# Patient Record
Sex: Female | Born: 1960 | Race: Black or African American | Hispanic: No | Marital: Single | Smoking: Never smoker
Health system: Southern US, Community
[De-identification: ages and names within clinical notes are randomized; demographics above are authoritative.]

## PROBLEM LIST (undated history)

## (undated) DIAGNOSIS — F329 Major depressive disorder, single episode, unspecified: Secondary | ICD-10-CM

## (undated) DIAGNOSIS — M792 Neuralgia and neuritis, unspecified: Secondary | ICD-10-CM

## (undated) DIAGNOSIS — R413 Other amnesia: Secondary | ICD-10-CM

## (undated) DIAGNOSIS — A048 Other specified bacterial intestinal infections: Secondary | ICD-10-CM

## (undated) DIAGNOSIS — R51 Headache: Principal | ICD-10-CM

## (undated) DIAGNOSIS — I7300 Raynaud's syndrome without gangrene: Secondary | ICD-10-CM

## (undated) DIAGNOSIS — M1990 Unspecified osteoarthritis, unspecified site: Secondary | ICD-10-CM

## (undated) DIAGNOSIS — M4690 Unspecified inflammatory spondylopathy, site unspecified: Secondary | ICD-10-CM

## (undated) DIAGNOSIS — G500 Trigeminal neuralgia: Secondary | ICD-10-CM

## (undated) DIAGNOSIS — F32A Depression, unspecified: Secondary | ICD-10-CM

## (undated) DIAGNOSIS — M47812 Spondylosis without myelopathy or radiculopathy, cervical region: Secondary | ICD-10-CM

## (undated) HISTORY — DX: Depression, unspecified: F32.A

## (undated) HISTORY — DX: Spondylosis without myelopathy or radiculopathy, cervical region: M47.812

## (undated) HISTORY — DX: Other specified bacterial intestinal infections: A04.8

## (undated) HISTORY — DX: Trigeminal neuralgia: G50.0

## (undated) HISTORY — DX: Headache: R51

## (undated) HISTORY — DX: Other amnesia: R41.3

## (undated) HISTORY — DX: Unspecified inflammatory spondylopathy, site unspecified: M46.90

## (undated) HISTORY — DX: Neuralgia and neuritis, unspecified: M79.2

## (undated) HISTORY — DX: Major depressive disorder, single episode, unspecified: F32.9

## (undated) HISTORY — PX: OTHER SURGICAL HISTORY: SHX169

## (undated) HISTORY — DX: Raynaud's syndrome without gangrene: I73.00

## (undated) HISTORY — DX: Unspecified osteoarthritis, unspecified site: M19.90

---
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
## 1983-01-11 HISTORY — PX: ABDOMINAL HYSTERECTOMY: SHX81

## 1983-01-11 HISTORY — PX: BREAST BIOPSY: SHX20

## 2012-07-29 ENCOUNTER — Emergency Department (HOSPITAL_COMMUNITY)
Admission: EM | Admit: 2012-07-29 | Discharge: 2012-07-29 | Disposition: A | Discharge status: Home / Self Care | Payer: Non-veteran care | Attending: Emergency Medicine | Admitting: Emergency Medicine

## 2012-07-29 ENCOUNTER — Emergency Department (HOSPITAL_COMMUNITY): Payer: Non-veteran care

## 2012-07-29 DIAGNOSIS — M25579 Pain in unspecified ankle and joints of unspecified foot: Secondary | ICD-10-CM | POA: Insufficient documentation

## 2012-07-29 DIAGNOSIS — L989 Disorder of the skin and subcutaneous tissue, unspecified: Secondary | ICD-10-CM | POA: Insufficient documentation

## 2012-07-29 DIAGNOSIS — M79671 Pain in right foot: Secondary | ICD-10-CM

## 2012-07-29 MED ORDER — HYDROCODONE-ACETAMINOPHEN 5-325 MG PO TABS
1.0000 | ORAL_TABLET | Freq: Once | ORAL | Status: AC
  Administered 2012-07-29: 1 via ORAL
  Filled 2012-07-29: qty 1

## 2012-07-29 MED ORDER — HYDROCODONE-ACETAMINOPHEN 5-325 MG PO TABS: 1.0000 | ORAL_TABLET | ORAL | Status: DC | PRN

## 2012-07-29 NOTE — ED Provider Notes (Signed)
History  This chart was scribed for Sharilyn Sites, PA-C working with Candyce Churn, MD by Greggory Stallion, ED scribe. This patient was seen in room TR09C/TR09C and the patient's care was started at 9:40 PM.  CSN: [id number] Arrival date & time 07/29/12  2127   Chief Complaint  Patient presents with  . Foot Pain   The history is provided by the patient. No language interpreter was used.    HPI Comments: Savannah Adkins is a 52 y.o. female who presents to the Emergency Department complaining of  sharp right foot pain that started earlier tonight. Pt denies trauma or injury to her right foot. Pt states she was eating dinner with her family and when she went to stand, there was sudden onset of sharp pain along the arch of her right foot.  No numbness or paresthesias of foot.  Patient has a large white lesion along the arch of her right foot which she states has been previously evaluated by podiatry without diagnosis.  Area has been increasing in size and causing pain when wearing shoes due to pressure.  No meds taken PTA.    No past medical history on file. No past surgical history on file. No family history on file. History  Substance Use Topics  . Smoking status: Not on file  . Smokeless tobacco: Not on file  . Alcohol Use: Not on file   OB History   No data available     Review of Systems  Musculoskeletal: Positive for arthralgias.  All other systems reviewed and are negative.    Allergies  Review of patient's allergies indicates not on file.  Home Medications  No current outpatient prescriptions on file.  BP 159/91  Pulse 98  Temp(Src) 98.3 F (36.8 C) (Oral)  Resp 18  SpO2 95%  Physical Exam  Nursing note and vitals reviewed. Constitutional: She is oriented to person, place, and time. She appears well-developed and well-nourished.  HENT:  Head: Normocephalic and atraumatic.  Mouth/Throat: Oropharynx is clear and moist.  Eyes: Conjunctivae and EOM are normal.  Pupils are equal, round, and reactive to light.  Neck: Normal range of motion.  Cardiovascular: Normal rate, regular rhythm and normal heart sounds.   Pulmonary/Chest: Effort normal and breath sounds normal.  Abdominal: Soft. Bowel sounds are normal.  Musculoskeletal:       Left ankle: She exhibits normal range of motion, no swelling, no ecchymosis, no deformity, no laceration and normal pulse. Tenderness. Achilles tendon normal.       Feet:  Pain directly adjacent to 2cm raised lesion at arch of right foot similar in appearance to a lipoma, no surrounding erythema, induration, or signs of cellulitis, normal ROM of foot, strong distal pulse, sensation intact  Neurological: She is alert and oriented to person, place, and time.  Skin: Skin is warm and dry.  Psychiatric: She has a normal mood and affect.    ED Course  Procedures (including critical care time)  DIAGNOSTIC STUDIES: Oxygen Saturation is 95% on RA, adequate by my interpretation.    COORDINATION OF CARE: 9:53 PM-Discussed treatment plan which includes xray with pt at bedside and pt agreed to plan.   Labs Reviewed - No data to display Dg Foot Complete Right  07/29/2012   *RADIOLOGY REPORT*  Clinical Data: Medial foot pain.  No known injury.  RIGHT FOOT COMPLETE - 3+ VIEW  Comparison: None.  Findings: No fracture or bone lesion.  The joints normally spaced and aligned.  The soft tissues  are unremarkable.  IMPRESSION: Normal right foot radiographs.   Original Report Authenticated By: Amie Portland, M.D.   1. Foot pain, right     MDM   X-ray negative for acute fracture dislocation. Lesion on foot appears to be lipoma with callus formation over top-- unsure if this is causing the pain but pt should have this evaluated further.  Rx vicodin.  [facility].  Discussed plan with pt, she agreed.  Return precautions advised.  I personally performed the services described in this documentation,  which was scribed in my presence. The recorded information has been reviewed and is accurate.   Garlon Hatchet, PA-C 07/29/12 2319  Garlon Hatchet, PA-C 07/29/12 [phone]

## 2012-07-29 NOTE — ED Notes (Signed)
Pt states anterior right foot started hurting right after dinner. Pain described as sharp. Denies trauma or injury.

## 2012-08-01 NOTE — ED Provider Notes (Signed)
Medical screening examination/treatment/procedure(s) were performed by non-physician practitioner and as supervising physician I was immediately available for consultation/collaboration.  Gokul Waybright David Evaan Tidwell, MD 08/01/12 1116 

## 2012-08-24 ENCOUNTER — Encounter: Payer: Self-pay | Admitting: Neurology

## 2012-08-24 ENCOUNTER — Ambulatory Visit (INDEPENDENT_AMBULATORY_CARE_PROVIDER_SITE_OTHER): Payer: Non-veteran care | Admitting: Neurology

## 2012-08-24 VITALS — BP 144/96 | HR 89 | Ht 66.0 in | Wt 198.0 lb

## 2012-08-24 DIAGNOSIS — R413 Other amnesia: Secondary | ICD-10-CM

## 2012-08-24 DIAGNOSIS — M47812 Spondylosis without myelopathy or radiculopathy, cervical region: Secondary | ICD-10-CM | POA: Insufficient documentation

## 2012-08-24 DIAGNOSIS — G500 Trigeminal neuralgia: Secondary | ICD-10-CM | POA: Insufficient documentation

## 2012-08-24 DIAGNOSIS — R209 Unspecified disturbances of skin sensation: Secondary | ICD-10-CM | POA: Insufficient documentation

## 2012-08-24 HISTORY — DX: Spondylosis without myelopathy or radiculopathy, cervical region: M47.812

## 2012-08-24 HISTORY — DX: Trigeminal neuralgia: G50.0

## 2012-08-24 HISTORY — DX: Other amnesia: R41.3

## 2012-08-24 MED ORDER — DULOXETINE HCL 30 MG PO CPEP: ORAL_CAPSULE | ORAL | Status: DC

## 2012-08-24 NOTE — Progress Notes (Signed)
Reason for visit: Numbness  Nayara Taplin is a 52 y.o. female  History of present illness:  Ms. Kendle is a 52 year old right-handed [place] female with a history of degenerative arthritis. The patient indicates that she has Raynaud's phenomenon, and at one point, she was felt to have lupus. The patient is on therapy for this, but recently, a rheumatologist indicated that she had osteoarthritis, not lupus. The patient reports that she has had some issues with intermittent numbness of the extremities, with numbness that comes and goes involving the hands up to the elbows, and the feet up to the hip level since 2009. The patient indicates that the numbness is present only at nighttime, and disappears during the day. This episode occurs every night, and it makes it difficult for her to sleep. The patient developed trigeminal neuralgia on the right V3 distribution in 2010, and she indicates that she underwent a brain scan through the [facility] The report of this is not available to me. The patient was tried on multiple medications for the trigeminal neuralgia, but she could not tolerate carbamazepine, or Dilantin. The patient was placed on gabapentin, and she could not tolerate this either secondary to burning in the feet and swelling in the ankles. The patient was told that she needed a gamma knife procedure, but she did not wish to do this. The patient reports ongoing problems with neck discomfort, and crepitus when turning the neck, particularly to the right side. The patient denies any weakness of the extremities, or difficulty controlling the bowels or the bladder. The patient denies any balance issues. The patient takes Flexeril, which does help some of her symptoms. The patient reports that she is having issues with her hair falling out. The patient does have some posterior headaches associated with the neck pain. The patient comes to this office for further evaluation.  Past Medical History  Diagnosis  Date  . Spondylitis   . Neuralgia     trigeminal  . Trigeminal neuralgia 08/24/2012    Right V2  . Cervical spondylosis without myelopathy 08/24/2012  . Memory disturbance 08/24/2012  . Depression   . Degenerative arthritis   . Raynaud disease   . H. pylori infection     Past Surgical History  Procedure Laterality Date  . Abdominal hysterectomy  1985  . Appendectomy  1972  . Breast biopsy  1985  . Tonsillectomy  1972  . Lumbosacral laminectomy      Family History  Problem Relation Age of Onset  . Cancer - Lung Brother   . Cancer Brother     stomach  . Cancer Maternal Aunt     breast, colon  . Diabetes Mother   . Heart attack Father     Social history:  reports that she has never smoked. She has never used smokeless tobacco. She reports that she does not drink alcohol or use illicit drugs.  Medications:  Current Outpatient Prescriptions on File Prior to Visit  Medication Sig Dispense Refill  . acetaminophen (TYLENOL) 325 MG tablet Take 650 mg by mouth every 6 (six) hours as needed for pain.      . cyclobenzaprine (FLEXERIL) 10 MG tablet Take 10 mg by mouth 3 (three) times daily as needed for muscle spasms.      Marland Kitchen dexlansoprazole (DEXILANT) 60 MG capsule Take 60 mg by mouth daily.      . hydroxychloroquine (PLAQUENIL) 200 MG tablet Take 400 mg by mouth daily.       No  current facility-administered medications on file prior to visit.    Allergies:  Allergies  Allergen Reactions  . Dilantin [Phenytoin Sodium Extended] Swelling  . Lortab [Hydrocodone-Acetaminophen] Itching  . Tramadol Itching  . Codeine Nausea And Vomiting  . Dilantin [Phenytoin]   . Erythromycin   . Gabapentin Other (See Comments)    Feet, head hurt and burning  . Lamisil [Terbinafine Hcl] Other (See Comments)    Elevated liver enzymes  . Penicillins Swelling  . Percocet [Oxycodone-Acetaminophen] Swelling  . Tetracyclines & Related     ROS:  Out of a complete 14 system review of symptoms,  the patient complains only of the following symptoms, and all other reviewed systems are negative.  Weight gain, fatigue Joint pain, joint swelling, achy muscles Memory loss, headache, numbness Anxiety, difficulty with sleep, change in appetite Restless legs  Blood pressure 144/96, pulse 89, height 5\' 6"  (1.676 m), weight 198 lb (89.812 kg).  Physical Exam  General: The patient is alert and cooperative at the time of the examination. The patient is minimally obese, affect is flat.  Head: Pupils are equal, round, and reactive to light. Discs are flat bilaterally.  Neck: The neck is supple, no carotid bruits are noted.  Respiratory: The respiratory examination is clear.  Cardiovascular: The cardiovascular examination reveals a regular rate and rhythm, no obvious murmurs or rubs are noted.  Neuromuscular: The patient lacks about 20 of full lateral rotation of the cervical spine bilaterally.  Skin: Extremities are without significant edema.  Neurologic Exam  Mental status:  Cranial nerves: Facial symmetry is present. There is good sensation of the face to pinprick and soft touch bilaterally. The strength of the facial muscles and the muscles to head turning and shoulder shrug are normal bilaterally. Speech is well enunciated, no aphasia or dysarthria is noted. Extraocular movements are full. Visual fields are full.  Motor: The motor testing reveals 5 over 5 strength of all 4 extremities. Good symmetric motor tone is noted throughout.  Sensory: Sensory testing is intact to pinprick, soft touch, vibration sensation, and position sense on all 4 extremities, with the exception that there is a stocking pattern pinprick sensory deficit up to the knees bilaterally. No evidence of extinction is noted.  Coordination: Cerebellar testing reveals good finger-nose-finger and heel-to-shin bilaterally.  Gait and station: Gait is normal. Tandem gait is normal. Romberg is negative. No drift is  seen.  Reflexes: Deep tendon reflexes are symmetric and normal bilaterally, with the exception that the left ankle jerk reflexes absent. Toes are downgoing bilaterally.   Assessment/Plan:  1. Episodic sensory changes, all 4 extremities  2. Osteoarthritis, cervical spondylosis  3. Right trigeminal neuralgia, V2 distribution  The patient will be set up for MRI evaluation of the brain, and of the cervical spine. Given the sensory alterations on all 4 extremities, and a history of trigeminal neuralgia, demyelinating disease needs to be ruled out. The patient will have some blood work done today. The patient will be placed on Cymbalta. The patient followup in 3-4 months.  Marlan Palau MD 08/26/2012 2:30 PM  [facility] [address] [place], [place] [address]  Phone [phone] Fax [phone]

## 2012-09-04 ENCOUNTER — Other Ambulatory Visit: Payer: Self-pay | Admitting: Neurology

## 2012-09-04 DIAGNOSIS — M47812 Spondylosis without myelopathy or radiculopathy, cervical region: Secondary | ICD-10-CM

## 2012-09-04 DIAGNOSIS — G500 Trigeminal neuralgia: Secondary | ICD-10-CM

## 2012-09-06 ENCOUNTER — Telehealth: Payer: Self-pay | Admitting: *Deleted

## 2012-09-06 LAB — RPR: RPR: NONREACTIVE

## 2012-09-06 LAB — TSH: TSH: 1.35 u[IU]/mL (ref 0.450–4.500)

## 2012-09-06 LAB — VITAMIN B12: Vitamin B-12: 988 pg/mL — ABNORMAL HIGH (ref 211–946)

## 2012-09-06 NOTE — Progress Notes (Signed)
Quick Note:  I spoke to patient and relayed normal labs, per Dr. Anne Hahn. ______

## 2012-09-06 NOTE — Progress Notes (Signed)
Add on with a creatinine level is noted.

## 2012-09-06 NOTE — Telephone Encounter (Signed)
Called with creatinine results 0.96.  Will fax to [facility] for MRI to be done tomorrow. Faxed to [phone].

## 2012-09-06 NOTE — Telephone Encounter (Signed)
Chip Boer called and is needing creatinine level for pt that is scheduled for Friday at 1730 tomorrow for contrast study.  Can be added on to last labs done, just need for [place] to call.   I called and spoke to [place] [phone] and asked her to add on to last labs drawn 09-04-12 as a stat.   She did this.  Will call with results.

## 2012-09-07 ENCOUNTER — Ambulatory Visit
Admission: RE | Admit: 2012-09-07 | Discharge: 2012-09-07 | Disposition: A | Discharge status: Home / Self Care | Payer: Non-veteran care | Source: Ambulatory Visit | Attending: Neurology | Admitting: Neurology

## 2012-09-07 DIAGNOSIS — G500 Trigeminal neuralgia: Secondary | ICD-10-CM

## 2012-09-07 DIAGNOSIS — M47812 Spondylosis without myelopathy or radiculopathy, cervical region: Secondary | ICD-10-CM

## 2012-09-07 DIAGNOSIS — R209 Unspecified disturbances of skin sensation: Secondary | ICD-10-CM

## 2012-09-07 MED ORDER — GADOBENATE DIMEGLUMINE 529 MG/ML IV SOLN
18.0000 mL | Freq: Once | INTRAVENOUS | Status: AC | PRN
  Administered 2012-09-07: 18 mL via INTRAVENOUS

## 2012-09-11 ENCOUNTER — Telehealth: Payer: Self-pay | Admitting: Neurology

## 2012-09-11 NOTE — Telephone Encounter (Signed)
I called patient. The MRI study of the brain and cervical spine do not show evidence of demyelinating disease. I discussed this with the patient.

## 2012-09-12 LAB — SPECIMEN STATUS REPORT

## 2012-10-09 ENCOUNTER — Encounter: Payer: Self-pay | Admitting: Neurology

## 2012-10-31 ENCOUNTER — Telehealth: Payer: Self-pay | Admitting: *Deleted

## 2012-11-01 NOTE — Telephone Encounter (Signed)
Pt came into the office. She explained that she received bill for her MRI's done back in 08/2012 ($4000.00).  She signed release for herself and Roosvelt Harps to fax records to them re: ordering of MRI's.  I gave her copies also (ofv note, MRI and labs).  [facility] Medicus [facility].

## 2012-11-01 NOTE — Telephone Encounter (Signed)
Patient came in and  picked up needed information

## 2013-03-14 ENCOUNTER — Encounter: Payer: Self-pay | Admitting: Neurology

## 2013-03-14 ENCOUNTER — Ambulatory Visit (INDEPENDENT_AMBULATORY_CARE_PROVIDER_SITE_OTHER): Payer: Non-veteran care | Admitting: Neurology

## 2013-03-14 ENCOUNTER — Encounter (INDEPENDENT_AMBULATORY_CARE_PROVIDER_SITE_OTHER): Payer: Self-pay

## 2013-03-14 VITALS — BP 137/94 | HR 94 | Wt 194.0 lb

## 2013-03-14 DIAGNOSIS — G500 Trigeminal neuralgia: Secondary | ICD-10-CM

## 2013-03-14 DIAGNOSIS — G4486 Cervicogenic headache: Secondary | ICD-10-CM

## 2013-03-14 DIAGNOSIS — S139XXA Sprain of joints and ligaments of unspecified parts of neck, initial encounter: Secondary | ICD-10-CM

## 2013-03-14 DIAGNOSIS — M47812 Spondylosis without myelopathy or radiculopathy, cervical region: Secondary | ICD-10-CM

## 2013-03-14 DIAGNOSIS — R51 Headache: Secondary | ICD-10-CM

## 2013-03-14 HISTORY — DX: Cervicogenic headache: G44.86

## 2013-03-14 MED ORDER — PREGABALIN 50 MG PO CAPS: 50.0000 mg | ORAL_CAPSULE | Freq: Two times a day (BID) | ORAL | Status: DC

## 2013-03-14 NOTE — Progress Notes (Signed)
Reason for visit: Trigeminal neuralgia, cervicogenic headache  Savannah Adkins is an 53 y.o. female  History of present illness:  Savannah Adkins is a 53 year old right-handed Savannah Adkins female with a history of cervical spondylosis, and cervical spasm. The patient has cervicogenic headache associated with this. The patient also reports some right-sided facial pain thought secondary to trigeminal neuralgia. In the past, the patient has not been intolerant to multiple medications that have included Dilantin, and carbamazepine and gabapentin. The patient was given a prescription for Cymbalta, but the [facility] would not cover this medication. The patient has been on Flexeril taking 10 mg 3 times daily, and she was placed on Zoloft taking 50 mg daily. The patient had one session of physical therapy through the [facility], and the patient was given cervical traction which resulted in a significant exacerbation of her neck pain and spasm. The cervical spine mobility has markedly been reduced. The patient has had increased pain. The patient denies pain down the arms, but she does have pain in the neck and into the shoulders. The patient has dizziness associated with the pain, and headaches coming up from the back of the head. The patient returns to this office for an evaluation. MRI evaluation of the brain shows minimal white matter changes, and MRI of the cervical spine shows some disc bulges and spondylosis. The patient overall is doing less well than when she was seen here last. The patient is unable to tolerate nonsteroidal anti-inflammatory medications.  Past Medical History  Diagnosis Date  . Spondylitis   . Neuralgia     trigeminal  . Trigeminal neuralgia 08/24/2012    Right V2  . Cervical spondylosis without myelopathy 08/24/2012  . Memory disturbance 08/24/2012  . Depression   . Degenerative arthritis   . Raynaud disease   . H. pylori infection   . Cervicogenic headache 03/14/2013    Past Surgical  History  Procedure Laterality Date  . Abdominal hysterectomy  1985  . Appendectomy  1972  . Breast biopsy  1985  . Tonsillectomy  1972  . Lumbosacral laminectomy      Family History  Problem Relation Age of Onset  . Cancer - Lung Brother   . Cancer Brother     stomach  . Cancer Maternal Aunt     breast, colon  . Diabetes Mother   . Heart attack Father   . Cancer Brother     Social history:  reports that she has never smoked. She has never used smokeless tobacco. She reports that she does not drink alcohol or use illicit drugs.    Allergies  Allergen Reactions  . Dilantin [Phenytoin Sodium Extended] Swelling  . Lortab [Hydrocodone-Acetaminophen] Itching  . Tramadol Itching  . Codeine Nausea And Vomiting  . Dilantin [Phenytoin]   . Erythromycin   . Gabapentin Other (See Comments)    Feet, head hurt and burning  . Lamisil [Terbinafine Hcl] Other (See Comments)    Elevated liver enzymes  . Penicillins Swelling  . Percocet [Oxycodone-Acetaminophen] Swelling  . Tetracyclines & Related     Medications:  Current Outpatient Prescriptions on File Prior to Visit  Medication Sig Dispense Refill  . acetaminophen (TYLENOL) 325 MG tablet Take 650 mg by mouth every 6 (six) hours as needed for pain.      . Cetirizine HCl 10 MG CAPS Take by mouth daily.      . cyclobenzaprine (FLEXERIL) 10 MG tablet Take 10 mg by mouth 3 (three) times daily  as needed for muscle spasms.      Marland Kitchen. dexlansoprazole (DEXILANT) 60 MG capsule Take 60 mg by mouth daily.      . hydroxychloroquine (PLAQUENIL) 200 MG tablet Take 400 mg by mouth daily.       No current facility-administered medications on file prior to visit.    ROS:  Out of a complete 14 system review of symptoms, the patient complains only of the following symptoms, and all other reviewed systems are negative.  Neck pain, neck stiffness Abdominal pain, nausea Back pain Memory loss, dizziness, headache, tremors  Blood pressure 137/94,  pulse 94, weight 194 lb (87.998 kg).  Physical Exam  General: The patient is alert and cooperative at the time of the examination.  Neuromuscular: The patient has minimal rotational movement of the cervical spine, and flexion and extension of the neck is severely limited.  Skin: No significant peripheral edema is noted.   Neurologic Exam  Mental status: The Mini-Mental status examination done today shows a total score 27/30.  Cranial nerves: Facial symmetry is present. Speech is normal, no aphasia or dysarthria is noted. Extraocular movements are full. Visual fields are full.  Motor: The patient has good strength in all 4 extremities.  Sensory examination: Soft touch sensation on the face and hands is symmetric.  Coordination: The patient has good finger-nose-finger and heel-to-shin bilaterally.  Gait and station: The patient has a normal gait. Tandem gait is normal. Romberg is negative. No drift is seen.  Reflexes: Deep tendon reflexes are symmetric.    MRI brain 09/11/2012:  IMPRESSION:  Equivocal MRI brain (with and without) demonstrating:  1. Minimal juxtacortical foci of non-specific gliosis. No abnormal lesions are seen on post contrast views.  2. No acute findings.   MRI cervical spine 09/11/2012:  IMPRESSION:  Abnormal MRI cervical spine (without) demonstrating:  1. At C3-4: disc bulging with moderate right and mild left foraminal stenosis  2. Disc bulging from C3-4 to C6-7 and at T2-3.    Assessment/Plan:  1. Cervical spondylosis  2. Cervical strain syndrome  3. Cervicogenic headache  4. Right sided trigeminal neuralgia  The patient will remain on the Flexeril taking 10 mg 3 times daily. The patient will be set up for neuromuscular therapy of the neck and shoulders. The patient will also be set up for an epidural steroid injection of the cervical spine. The patient will followup through this office in about 4 months.  Marlan Palau. Keith Janavia Rottman MD 03/14/2013 1:57  PM  [facility] [address] [place], [place] [id number]  Phone [phone] Fax [phone]

## 2013-03-14 NOTE — Patient Instructions (Signed)
Take multivitamins with Zinc and Selenium.   Cervical Sprain A cervical sprain is when the tissues (ligaments) that hold the neck bones in place stretch or tear. HOME CARE   Put ice on the injured area.  Put ice in a plastic bag.  Place a towel between your skin and the bag.  Leave the ice on for 15 20 minutes, 3 4 times a day.  You may have been given a collar to wear. This collar keeps your neck from moving while you heal.  Do not take the collar off unless told by your doctor.  If you have long hair, keep it outside of the collar.  Ask your doctor before changing the position of your collar. You may need to change its position over time to make it more comfortable.  If you are allowed to take off the collar for cleaning or bathing, follow your doctor's instructions on how to do it safely.  Keep your collar clean by wiping it with mild soap and water. Dry it completely. If the collar has removable pads, remove them every 1 2 days to hand wash them with soap and water. Allow them to air dry. They should be dry before you wear them in the collar.  Do not drive while wearing the collar.  Only take medicine as told by your doctor.  Keep all doctor visits as told.  Keep all physical therapy visits as told.  Adjust your work station so that you have good posture while you work.  Avoid positions and activities that make your problems worse.  Warm up and stretch before being active. GET HELP IF:  Your pain is not controlled with medicine.  You cannot take less pain medicine over time as planned.  Your activity level does not improve as expected. GET HELP RIGHT AWAY IF:   You are bleeding.  Your stomach is upset.  You have an allergic reaction to your medicine.  You develop new problems that you cannot explain.  You lose feeling (become numb) or you cannot move any part of your body (paralysis).  You have tingling or weakness in any part of your body.  Your  symptoms get worse. Symptoms include:  Pain, soreness, stiffness, puffiness (swelling), or a burning feeling in your neck.  Pain when your neck is touched.  Shoulder or upper back pain.  Limited ability to move your neck.  Headache.  Dizziness.  Your hands or arms feel week, lose feeling, or tingle.  Muscle spasms.  Difficulty swallowing or chewing. MAKE SURE YOU:   Understand these instructions.  Will watch your condition.  Will get help right away if you are not doing well or get worse. Document Released: 06/15/2007 Document Revised: 08/29/2012 Document Reviewed: 07/04/2012 [facility] Patient Information 2014 [place], [place].

## 2013-03-28 ENCOUNTER — Ambulatory Visit: Payer: Non-veteran care | Admitting: Physical Therapy

## 2013-04-01 ENCOUNTER — Ambulatory Visit: Payer: Non-veteran care | Admitting: Physical Therapy

## 2013-04-09 ENCOUNTER — Ambulatory Visit: Payer: Non-veteran care | Attending: Internal Medicine

## 2013-04-09 DIAGNOSIS — IMO0001 Reserved for inherently not codable concepts without codable children: Secondary | ICD-10-CM | POA: Insufficient documentation

## 2013-04-09 DIAGNOSIS — M542 Cervicalgia: Secondary | ICD-10-CM | POA: Insufficient documentation

## 2013-04-09 DIAGNOSIS — R293 Abnormal posture: Secondary | ICD-10-CM | POA: Insufficient documentation

## 2013-04-15 ENCOUNTER — Ambulatory Visit: Payer: Non-veteran care | Attending: Internal Medicine | Admitting: Rehabilitation

## 2013-04-15 DIAGNOSIS — IMO0001 Reserved for inherently not codable concepts without codable children: Secondary | ICD-10-CM | POA: Diagnosis not present

## 2013-04-15 DIAGNOSIS — R293 Abnormal posture: Secondary | ICD-10-CM | POA: Diagnosis not present

## 2013-04-15 DIAGNOSIS — M542 Cervicalgia: Secondary | ICD-10-CM | POA: Insufficient documentation

## 2013-04-17 ENCOUNTER — Ambulatory Visit: Payer: Non-veteran care | Admitting: Rehabilitation

## 2013-04-17 DIAGNOSIS — IMO0001 Reserved for inherently not codable concepts without codable children: Secondary | ICD-10-CM | POA: Diagnosis not present

## 2013-04-22 ENCOUNTER — Ambulatory Visit: Payer: Non-veteran care | Admitting: Physical Therapy

## 2013-04-22 DIAGNOSIS — IMO0001 Reserved for inherently not codable concepts without codable children: Secondary | ICD-10-CM | POA: Diagnosis not present

## 2013-04-24 ENCOUNTER — Ambulatory Visit: Payer: Non-veteran care | Admitting: Rehabilitation

## 2013-04-24 DIAGNOSIS — IMO0001 Reserved for inherently not codable concepts without codable children: Secondary | ICD-10-CM | POA: Diagnosis not present

## 2013-04-30 ENCOUNTER — Ambulatory Visit: Payer: Non-veteran care | Admitting: Physical Therapy

## 2013-04-30 DIAGNOSIS — IMO0001 Reserved for inherently not codable concepts without codable children: Secondary | ICD-10-CM | POA: Diagnosis not present

## 2013-05-06 ENCOUNTER — Ambulatory Visit: Payer: Non-veteran care | Admitting: Rehabilitation

## 2013-05-06 DIAGNOSIS — IMO0001 Reserved for inherently not codable concepts without codable children: Secondary | ICD-10-CM | POA: Diagnosis not present

## 2013-05-08 ENCOUNTER — Ambulatory Visit: Payer: Non-veteran care | Admitting: Rehabilitation

## 2013-05-14 ENCOUNTER — Ambulatory Visit: Payer: Non-veteran care | Attending: Internal Medicine | Admitting: Physical Therapy

## 2013-05-14 DIAGNOSIS — M542 Cervicalgia: Secondary | ICD-10-CM | POA: Insufficient documentation

## 2013-05-14 DIAGNOSIS — R293 Abnormal posture: Secondary | ICD-10-CM | POA: Insufficient documentation

## 2013-05-14 DIAGNOSIS — IMO0001 Reserved for inherently not codable concepts without codable children: Secondary | ICD-10-CM | POA: Insufficient documentation

## 2013-07-25 ENCOUNTER — Ambulatory Visit (INDEPENDENT_AMBULATORY_CARE_PROVIDER_SITE_OTHER): Payer: Non-veteran care | Admitting: Neurology

## 2013-07-25 ENCOUNTER — Encounter: Payer: Self-pay | Admitting: Neurology

## 2013-07-25 VITALS — BP 127/83 | HR 91 | Ht 67.0 in | Wt 199.0 lb

## 2013-07-25 DIAGNOSIS — R51 Headache: Secondary | ICD-10-CM | POA: Diagnosis not present

## 2013-07-25 DIAGNOSIS — G4486 Cervicogenic headache: Secondary | ICD-10-CM

## 2013-07-25 DIAGNOSIS — G500 Trigeminal neuralgia: Secondary | ICD-10-CM | POA: Diagnosis not present

## 2013-07-25 DIAGNOSIS — M47812 Spondylosis without myelopathy or radiculopathy, cervical region: Secondary | ICD-10-CM | POA: Diagnosis not present

## 2013-07-25 MED ORDER — TIZANIDINE HCL 2 MG PO TABS: 2.0000 mg | ORAL_TABLET | Freq: Three times a day (TID) | ORAL | Status: AC

## 2013-07-25 NOTE — Patient Instructions (Signed)
Trigeminal Neuralgia  Trigeminal neuralgia is a nerve disorder that causes sudden attacks of severe facial pain. It is caused by damage to the trigeminal nerve, a major nerve in the face. It is more common in women and in the elderly, although it can also happen in younger patients. Attacks last from a few seconds to several minutes and can occur from a couple of times per year to several times per day. Trigeminal neuralgia can be a very distressing and disabling condition. Surgery may be needed in very severe cases if medical treatment does not give relief.  HOME CARE INSTRUCTIONS    If your caregiver prescribed medication to help prevent attacks, take as directed.   To help prevent attacks:   Chew on the unaffected side of the mouth.   Avoid touching your face.   Avoid blasts of hot or cold air.   Men may wish to grow a beard to avoid having to shave.  SEEK IMMEDIATE MEDICAL CARE IF:   Pain is unbearable and your medicine does not help.   You develop new, unexplained symptoms (problems).   You have problems that may be related to a medication you are taking.  Document Released: 12/25/1999 Document Revised: 03/21/2011 Document Reviewed: 10/24/2008  ExitCare Patient Information 2015 [employer]. This information is not intended to replace advice given to you by your health care provider. Make sure you discuss any questions you have with your health care provider.

## 2013-07-25 NOTE — Progress Notes (Signed)
Reason for visit: Cervicogenic headache  Charlestine Nightrish E Gomez is an 53 y.o. female  History of present illness:  Ms. Vedia CofferBlack is a 53 year old right-handed [place] female with a history of possible lupus, and degenerative arthritis. The patient has cervical spondylosis documented by cervical MRI. She also has some trigeminal neuralgia issues. The patient has been unable to tolerate several medications such as Lyrica and gabapentin, Dilantin, and carbamazepine. The patient has a lot of neck discomfort, and she will have intermittent tingling down arms associated with this. The patient has undergone physical therapy, and she seemed to be gaining benefit, but the number of visits was limited [facility]. The patient was placed on Flexeril, taking 10 mg 3 times daily. She indicates that this is well-tolerated, but she is having some problems with blurring of vision she stands up too long. She is trying to stretch out on a regular basis, but her mobility of the cervical spine has declined. She returns to this office for an evaluation. The patient reports that her headaches come up in the back of the head, up to the top of the head.   Past Medical History  Diagnosis Date  . Spondylitis   . Neuralgia     trigeminal  . Trigeminal neuralgia 08/24/2012    Right V2  . Cervical spondylosis without myelopathy 08/24/2012  . Memory disturbance 08/24/2012  . Depression   . Degenerative arthritis   . Raynaud disease   . H. pylori infection   . Cervicogenic headache 03/14/2013    Past Surgical History  Procedure Laterality Date  . Abdominal hysterectomy  1985  . Appendectomy  1972  . Breast biopsy  1985  . Tonsillectomy  1972  . Lumbosacral laminectomy      Family History  Problem Relation Age of Onset  . Cancer - Lung Brother   . Cancer Brother     stomach  . Cancer Maternal Aunt     breast, colon  . Diabetes Mother   . Heart attack Father   . Cancer Brother     Social history:  reports  that she has never smoked. She has never used smokeless tobacco. She reports that she does not drink alcohol or use illicit drugs.    Allergies  Allergen Reactions  . Dilantin [Phenytoin Sodium Extended] Swelling  . Lortab [Hydrocodone-Acetaminophen] Itching  . Tramadol Itching  . Codeine Nausea And Vomiting  . Dilantin [Phenytoin]   . Erythromycin   . Gabapentin Other (See Comments)    Feet, head hurt and burning  . Lamisil [Terbinafine Hcl] Other (See Comments)    Elevated liver enzymes  . Lyrica [Pregabalin] Swelling  . Penicillins Swelling  . Percocet [Oxycodone-Acetaminophen] Swelling  . Tetracyclines & Related     Medications:  Current Outpatient Prescriptions on File Prior to Visit  Medication Sig Dispense Refill  . acetaminophen (TYLENOL) 325 MG tablet Take 650 mg by mouth every 6 (six) hours as needed for pain.      . Cetirizine HCl 10 MG CAPS Take by mouth daily.      Marland Kitchen. dexlansoprazole (DEXILANT) 60 MG capsule Take 60 mg by mouth daily.      . hydroxychloroquine (PLAQUENIL) 200 MG tablet Take 400 mg by mouth daily.      . sertraline (ZOLOFT) 25 MG tablet Take 50 mg by mouth daily.       No current facility-administered medications on file prior to visit.    ROS:  Out of a complete  14 system review of symptoms, the patient complains only of the following symptoms, and all other reviewed systems are negative.  Blurred vision Excessive eating Joint pain, back pain, walking difficulties, neck pain Dizziness, headache, numbness Depression, anxiety  Blood pressure 127/83, pulse 91, height 5\' 7"  (1.702 m), weight 199 lb (90.266 kg).  Physical Exam  General: The patient is alert and cooperative at the time of the examination. The patient has a flat affect.  Neuromuscular: The patient has significant limitation of rotational movement cervical spine, able to turn the head only about 20 to the right and left.  Skin: No significant peripheral edema is  noted.   Neurologic Exam  Mental status: The patient is oriented x 3.  Cranial nerves: Facial symmetry is present. Speech is normal, no aphasia or dysarthria is noted. Extraocular movements are full. Visual fields are full.  Motor: The patient has good strength in all 4 extremities.  Sensory examination: Soft touch sensation on the face, arms, and legs is symmetric.  Coordination: The patient has good finger-nose-finger and heel-to-shin bilaterally.  Gait and station: The patient has a normal gait. Tandem gait is normal. Romberg is negative. No drift is seen.  Reflexes: Deep tendon reflexes are symmetric.   Cervical spine MRI 09/11/2012:  IMPRESSION:  Abnormal MRI cervical spine (without) demonstrating:  1. At C3-4: disc bulging with moderate right and mild left foraminal stenosis  2. Disc bulging from C3-4 to C6-7 and at T2-3.    Assessment/Plan:  1. Trigeminal neuralgia, right V2 distribution  2. Cervical spondylosis  3. Cervicogenic headache  The patient may be having some side effects on the Flexeril, she reports blurred vision with standing. The patient will be off of Flexeril, and switched to tizanidine. She will start at a 2 mg 3 times daily dose, but this dose can be increased depending on her tolerance and response to the medication. The patient will be set up once again for a cervical epidural steroid injection, this was never done previously. The patient will continue her neuromuscular therapy, stretching exercises. She will followup in 3-4 months.  Marlan Palau MD 07/25/2013 8:27 AM  [facility] [address] [place], [place] [address]  Phone [phone] Fax [phone]   Primary care physician for this patient to the [facility] is:  Dr. [facility] Phone number [phone], extension [phone] Fax number [phone].

## 2013-07-29 ENCOUNTER — Other Ambulatory Visit: Payer: Self-pay | Admitting: Neurology

## 2013-07-29 DIAGNOSIS — G4486 Cervicogenic headache: Secondary | ICD-10-CM

## 2013-07-29 DIAGNOSIS — R51 Headache: Secondary | ICD-10-CM

## 2013-07-29 DIAGNOSIS — M47812 Spondylosis without myelopathy or radiculopathy, cervical region: Secondary | ICD-10-CM

## 2013-07-29 DIAGNOSIS — G500 Trigeminal neuralgia: Secondary | ICD-10-CM

## 2013-07-31 ENCOUNTER — Telehealth: Payer: Self-pay | Admitting: Neurology

## 2013-07-31 NOTE — Telephone Encounter (Signed)
Spoke to [place] at [facility] and she received a referral for epidural injection for patient.  The [place] [facility] and they need to authorize before procedure can be done.  The doctors CMA is aware and will follow up.  If the [facility] approves to have [facility] we will notify them.

## 2013-11-21 ENCOUNTER — Encounter: Payer: Self-pay | Admitting: Adult Health

## 2013-11-21 ENCOUNTER — Ambulatory Visit (INDEPENDENT_AMBULATORY_CARE_PROVIDER_SITE_OTHER): Payer: Non-veteran care | Admitting: Adult Health

## 2013-11-21 VITALS — BP 130/82 | HR 85 | Temp 98.5°F | Ht 66.0 in | Wt 197.0 lb

## 2013-11-21 DIAGNOSIS — G500 Trigeminal neuralgia: Secondary | ICD-10-CM

## 2013-11-21 DIAGNOSIS — M47812 Spondylosis without myelopathy or radiculopathy, cervical region: Secondary | ICD-10-CM

## 2013-11-21 DIAGNOSIS — R51 Headache: Secondary | ICD-10-CM

## 2013-11-21 DIAGNOSIS — G4486 Cervicogenic headache: Secondary | ICD-10-CM

## 2013-11-21 MED ORDER — OXCARBAZEPINE 300 MG PO TABS: 300.0000 mg | ORAL_TABLET | Freq: Every day | ORAL | Status: AC

## 2013-11-21 NOTE — Patient Instructions (Signed)

## 2013-11-21 NOTE — Progress Notes (Signed)
I have read the note, and I agree with the clinical assessment and plan.  Savannah Adkins KEITH   

## 2013-11-21 NOTE — Progress Notes (Signed)
PATIENT: Savannah Adkins DOB: 1960/02/02  REASON FOR VISIT: follow up HISTORY FROM: patient  HISTORY OF PRESENT ILLNESS: Savannah Adkins is a 53 year old female with a history of cervical spondylosis, cervicogenic headache and trigeminal neuralgia. She was started on Tizanidine and reports that it was not  been beneficial at first. Her PCP double her dose to 4 mg TID for about 1 week ago and she has already noticed a benefit. She is getting ready to start therapy through the [place].  She was also sent for an epidural steroid injection but the [facility] has not yet approved this procedure so she wasn't able to have that done yet.  She states that she does have trigeminal nerve pain and was on trileptal but has not been on it for a while. She states recently she had 3-4 flare ups a week in the right side. She stopped taking this medication  when she moved. She would like to restart the trileptal.    HISTORY 07/25/13 (WILLIS):  53 year old right-handed Savannah Adkins female with a history of possible lupus, and degenerative arthritis. The patient has cervical spondylosis documented by cervical MRI. She also has some trigeminal neuralgia issues. The patient has been unable to tolerate several medications such as Lyrica and gabapentin, Dilantin, and carbamazepine. The patient has a lot of neck discomfort, and she will have intermittent tingling down arms associated with this. The patient has undergone physical therapy, and she seemed to be gaining benefit, but the number of visits was limited through the [facility]. The patient was placed on Flexeril, taking 10 mg 3 times daily. She indicates that this is well-tolerated, but she is having some problems with blurring of vision she stands up too long. She is trying to stretch out on a regular basis, but her mobility of the cervical spine has declined. She returns to this office for an evaluation. The patient reports that her headaches come up in the back of the head, up to the top of  the head.   REVIEW OF SYSTEMS: Out of a complete 14 system review of symptoms, the patient complains only of the following symptoms, and all other reviewed systems are negative.  Appetite change Excessive eating Abdominal pain, diarrhea Restless leg, insomnia Joint pain, back pain, aching muscles, muscle cramps, neck pain, neck stiffness Itching Memory loss, headache, numbness, tremors Depression    ALLERGIES: Allergies  Allergen Reactions  . Dilantin [Phenytoin Sodium Extended] Swelling  . Lortab [Hydrocodone-Acetaminophen] Itching  . Tramadol Itching  . Codeine Nausea And Vomiting  . Dilantin [Phenytoin]   . Erythromycin   . Gabapentin Other (See Comments)    Feet, head hurt and burning  . Lamisil [Terbinafine Hcl] Other (See Comments)    Elevated liver enzymes  . Lyrica [Pregabalin] Swelling  . Penicillins Swelling  . Percocet [Oxycodone-Acetaminophen] Swelling  . Tetracyclines & Related     HOME MEDICATIONS: Outpatient Prescriptions Prior to Visit  Medication Sig Dispense Refill  . acetaminophen (TYLENOL) 325 MG tablet Take 650 mg by mouth every 6 (six) hours as needed for pain.    . Cetirizine HCl 10 MG CAPS Take by mouth daily.    Marland Kitchen dexlansoprazole (DEXILANT) 60 MG capsule Take 60 mg by mouth daily.    . hydroxychloroquine (PLAQUENIL) 200 MG tablet Take 400 mg by mouth daily.    . sertraline (ZOLOFT) 25 MG tablet Take 50 mg by mouth daily.    Marland Kitchen tiZANidine (ZANAFLEX) 2 MG tablet Take 1 tablet (2 mg  total) by mouth 3 (three) times daily. 90 tablet 3   No facility-administered medications prior to visit.    PAST MEDICAL HISTORY: Past Medical History  Diagnosis Date  . Spondylitis   . Neuralgia     trigeminal  . Trigeminal neuralgia 08/24/2012    Right V2  . Cervical spondylosis without myelopathy 08/24/2012  . Memory disturbance 08/24/2012  . Depression   . Degenerative arthritis   . Raynaud disease   . H. pylori infection   . Cervicogenic headache  03/14/2013    PAST SURGICAL HISTORY: Past Surgical History  Procedure Laterality Date  . Abdominal hysterectomy  1985  . Appendectomy  1972  . Breast biopsy  1985  . Tonsillectomy  1972  . Lumbosacral laminectomy      FAMILY HISTORY: Family History  Problem Relation Age of Onset  . Cancer - Lung Brother   . Cancer Brother     stomach  . Cancer Maternal Aunt     breast, colon  . Diabetes Mother   . Heart attack Father   . Cancer Brother     SOCIAL HISTORY: History   Social History  . Marital Status: Single    Spouse Name: N/A    Number of Children: 2  . Years of Education: 12   Occupational History  . unemployed12    Social History Main Topics  . Smoking status: Never Smoker   . Smokeless tobacco: Never Used  . Alcohol Use: No  . Drug Use: No  . Sexual Activity: Not on file   Other Topics Concern  . Not on file   Social History Narrative      PHYSICAL EXAM  Filed Vitals:   11/21/13 1402  BP: 130/82  Pulse: 85  Temp: 98.5 F (36.9 C)  TempSrc: Oral  Height: 5\' 6"  (1.676 m)  Weight: 197 lb (89.359 kg)   Body mass index is 31.81 kg/(m^2).  Generalized: Well developed, in no acute distress   Neurological examination  Mentation: Alert oriented to time, place, history taking. Follows all commands speech and language fluent Cranial nerve II-XII: Pupils were equal round reactive to light. Extraocular movements were full, visual field were full on confrontational test. Facial sensation and strength were normal. Uvula tongue midline. Head turning and shoulder shrug  were normal and symmetric. Motor: The motor testing reveals 5 over 5 strength of all 4 extremities. Good symmetric motor tone is noted throughout.  Sensory: Sensory testing is intact to soft touch on all 4 extremities. No evidence of extinction is noted.  Coordination: Cerebellar testing reveals good finger-nose-finger and heel-to-shin bilaterally.  Gait and station: Gait is normal. Tandem  gait is normal. Romberg is negative. No drift is seen.  Reflexes: Deep tendon reflexes are symmetric and normal bilaterally.    DIAGNOSTIC DATA (LABS, IMAGING, TESTING) - I reviewed patient records, labs, notes, testing and imaging myself where available.  No results found for: WBC, HGB, HCT, MCV, PLT    Component Value Date/Time   CREATININE 0.96 09/04/2012 1005   GFRNONAA 67 09/04/2012 1005   GFRAA 78 09/04/2012 1005   Lab Results  Component Value Date   VITAMINB12 988* 09/04/2012   Lab Results  Component Value Date   TSH 1.350 09/04/2012      ASSESSMENT AND PLAN 53 y.o. year old female  has a past medical history of Spondylitis; Neuralgia; Trigeminal neuralgia (08/24/2012); Cervical spondylosis without myelopathy (08/24/2012); Memory disturbance (08/24/2012); Depression; Degenerative arthritis; Raynaud disease; H. pylori infection; and Cervicogenic headache (  03/14/2013). here with:  1. Cervical spondylosis 2. Cervicogenic headache 3. Trigeminal Neuralgia   Tizanidine was increased by her PCP. She states that her neck discomfort has improved in the last week. She will also be [facility] this [facility]. She was on Trileptal for trigeminal Neuralgia but had stopped taking it when she moved. She has been having 3-4 flare ups each week on the right side. She would like a prescription for this. I will give her Trileptal 300 mg daily. We may have to increase the dose in the future. If the patients symptoms worsen or she develops new symptoms she should let us know. She will follow-up in 6 months or sooner if needed.   Butch PennyMegan Ridhima Golberg, MSN, NP-C 11/21/2013, 2:24 PM [facility] [address], [address] [place], [place] [address] [phone]  Note: This document was prepared with digital dictation and possible smart phrase technology. Any transcriptional errors that result from this process are unintentional.

## 2013-11-25 ENCOUNTER — Ambulatory Visit: Payer: Non-veteran care | Admitting: Adult Health

## 2014-09-16 IMAGING — CR DG FOOT COMPLETE 3+V*R*
3 series · 3 of 3 positions shown · non-contrast
Comparison: None.

CLINICAL DATA: Medial foot pain.  No known injury.

RIGHT FOOT COMPLETE - 3+ VIEW

[x foot lat right]
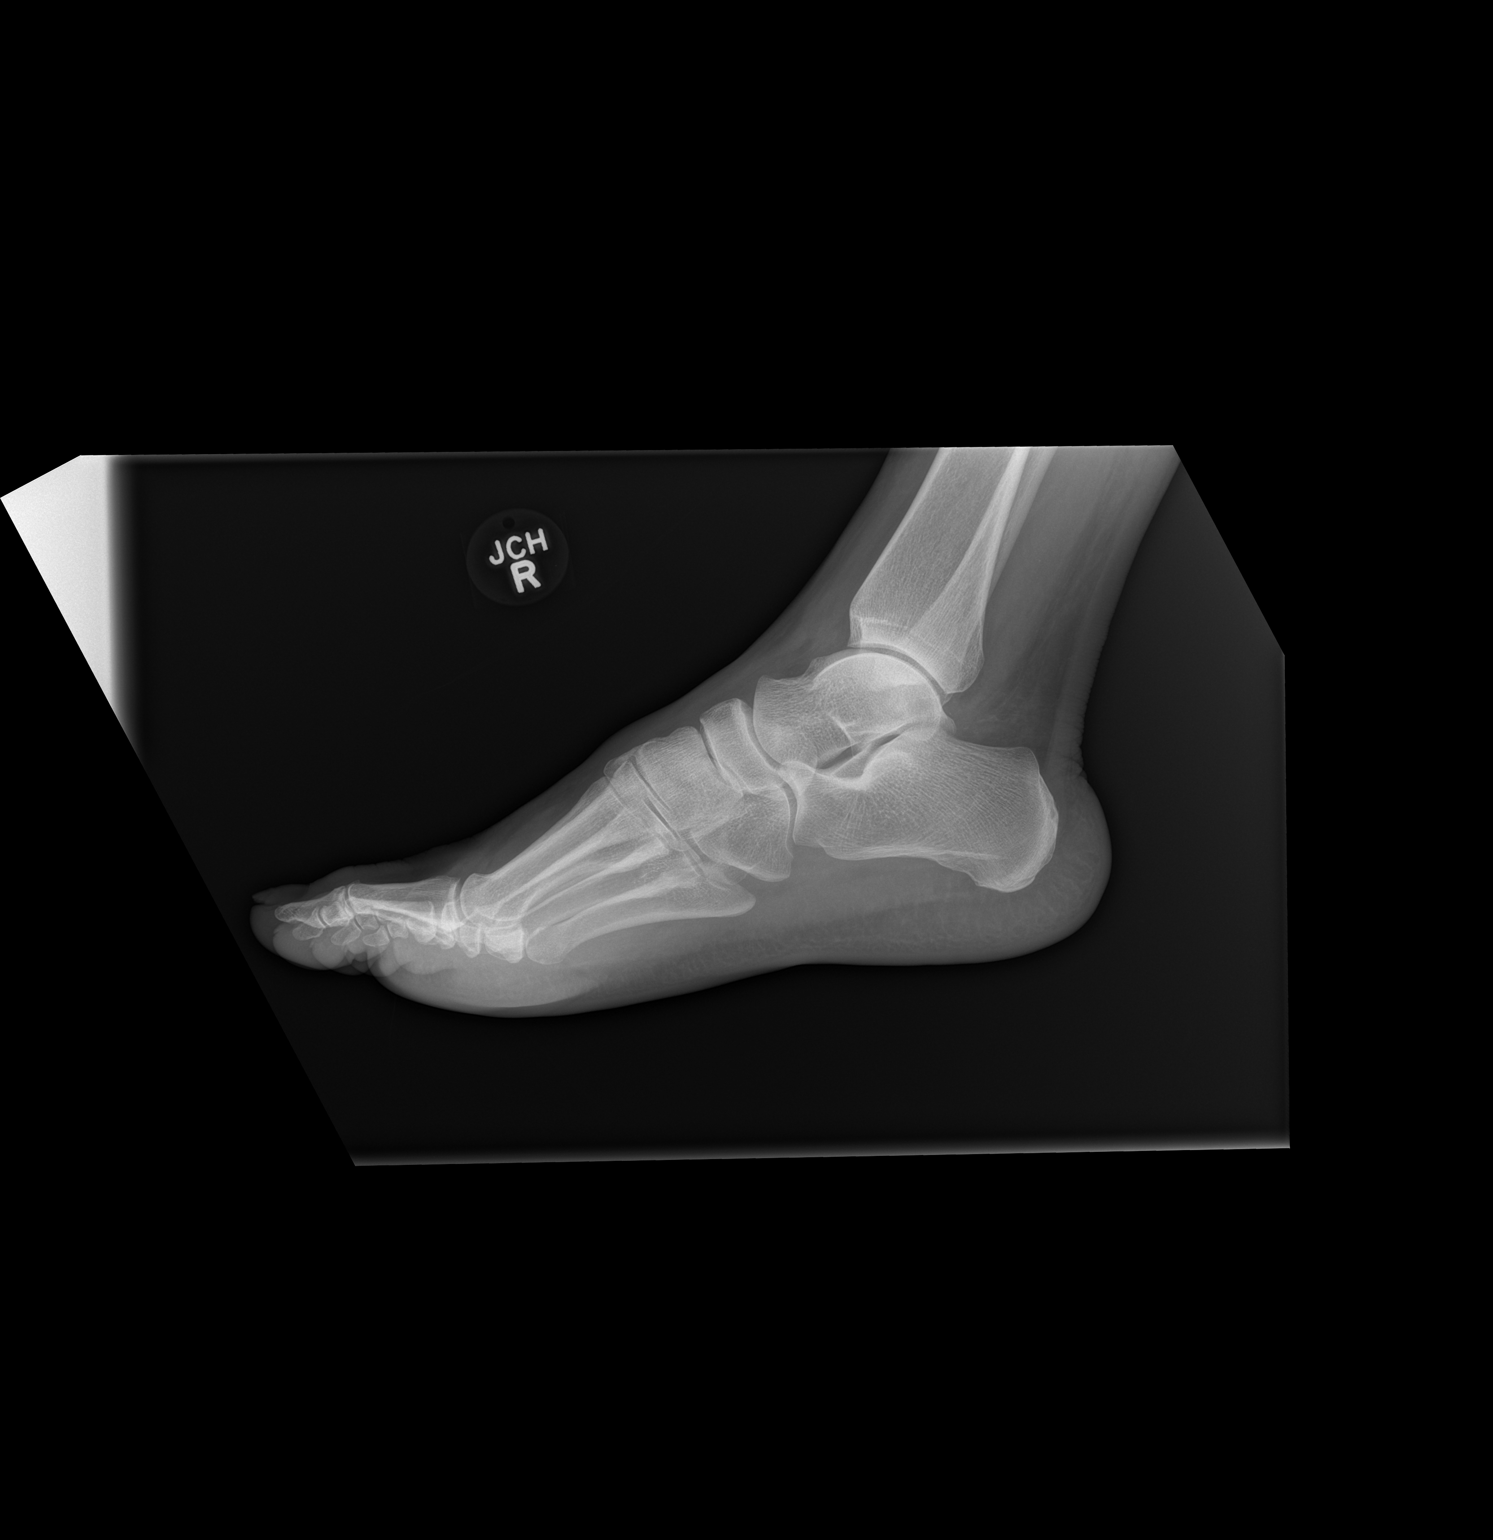

[x foot ap right]
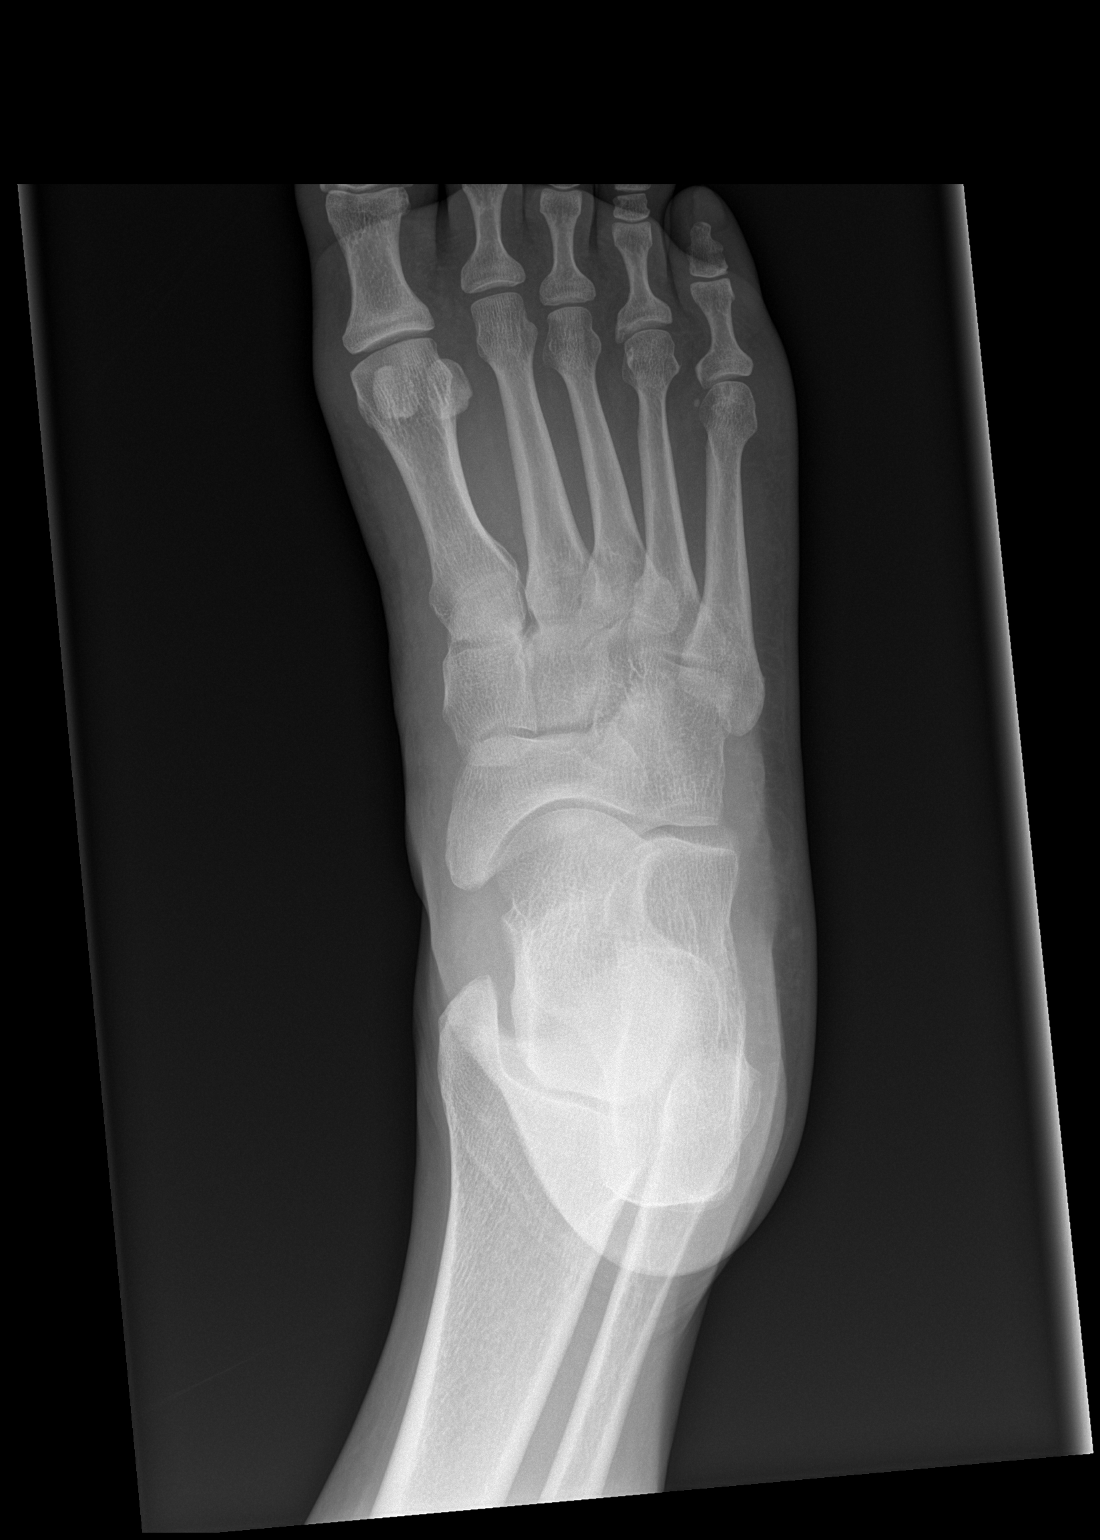

[x foot obl right]
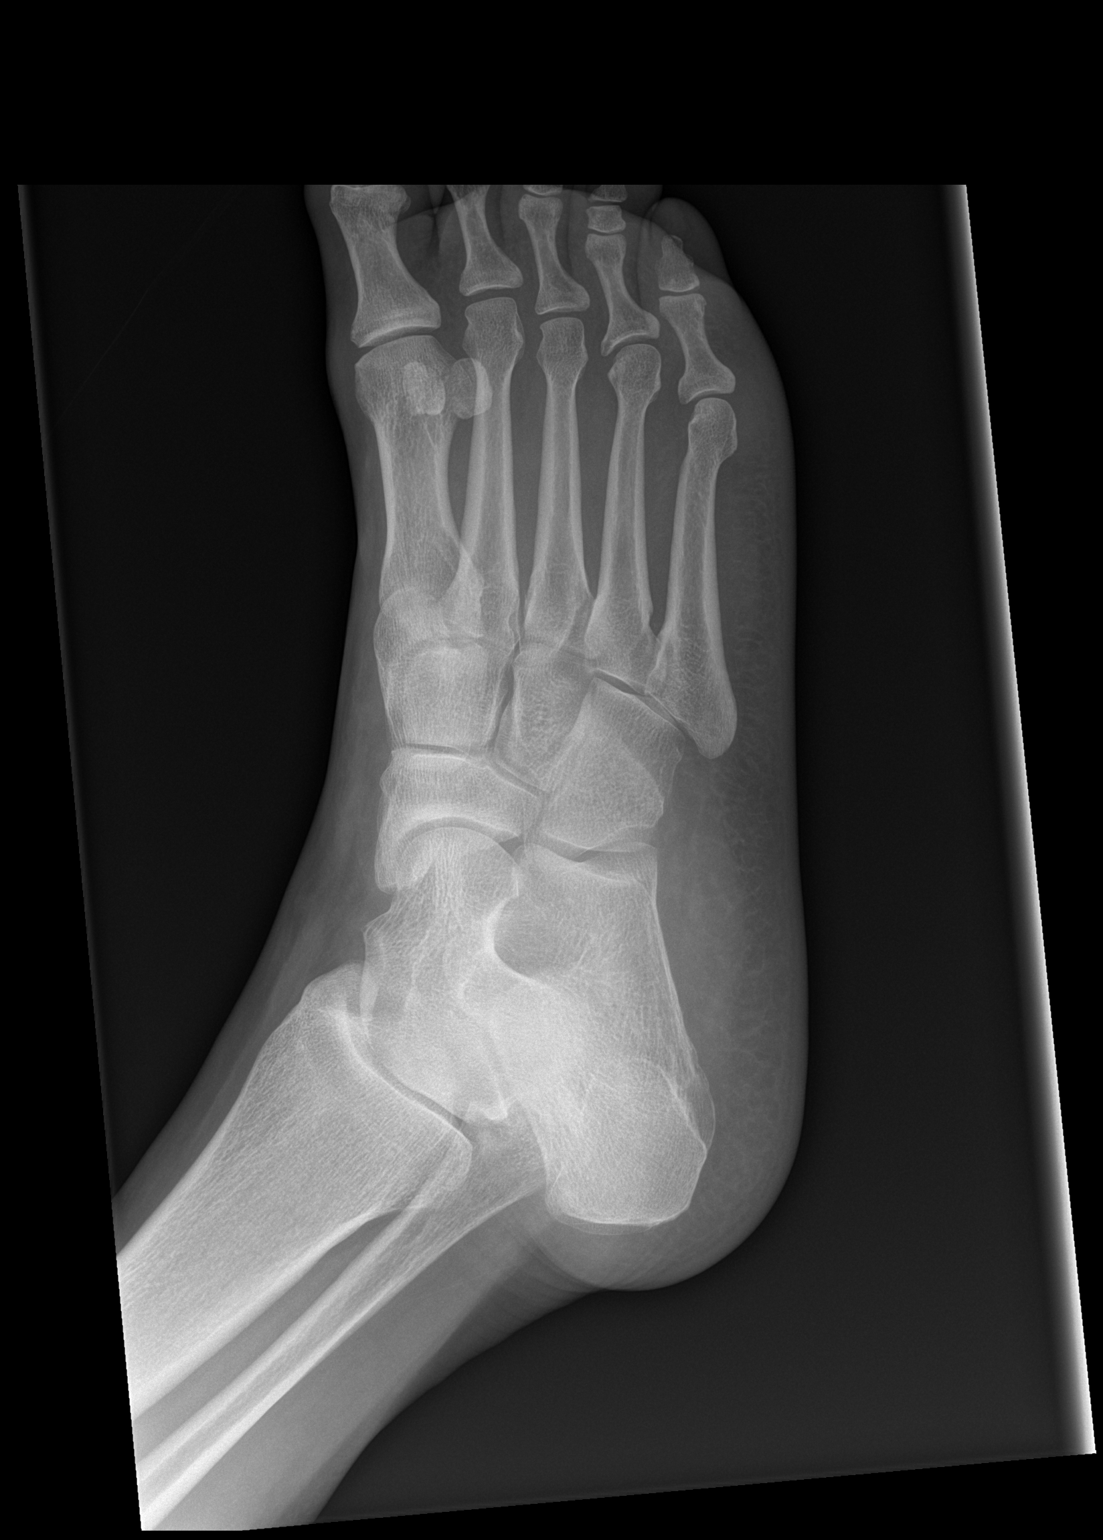

[3 of 3 positions shown; findings below may reference images not displayed]

FINDINGS: No fracture or bone lesion.  The joints normally spaced
and aligned.  The soft tissues are unremarkable.
IMPRESSION: Normal right foot radiographs.

## 2020-01-20 ENCOUNTER — Other Ambulatory Visit: Payer: Non-veteran care

## 2020-01-20 DIAGNOSIS — Z20822 Contact with and (suspected) exposure to covid-19: Secondary | ICD-10-CM

## 2020-01-24 LAB — NOVEL CORONAVIRUS, NAA: SARS-CoV-2, NAA: DETECTED — AB

## 2022-04-29 ENCOUNTER — Ambulatory Visit
Admission: EM | Admit: 2022-04-29 | Discharge: 2022-04-29 | Disposition: A | Discharge status: Home / Self Care | Payer: No Typology Code available for payment source | Attending: Emergency Medicine | Admitting: Emergency Medicine

## 2022-04-29 DIAGNOSIS — K047 Periapical abscess without sinus: Secondary | ICD-10-CM

## 2022-04-29 DIAGNOSIS — R051 Acute cough: Secondary | ICD-10-CM

## 2022-04-29 DIAGNOSIS — K029 Dental caries, unspecified: Secondary | ICD-10-CM

## 2022-04-29 DIAGNOSIS — R21 Rash and other nonspecific skin eruption: Secondary | ICD-10-CM

## 2022-04-29 DIAGNOSIS — R0982 Postnasal drip: Secondary | ICD-10-CM

## 2022-04-29 DIAGNOSIS — J302 Other seasonal allergic rhinitis: Secondary | ICD-10-CM | POA: Diagnosis not present

## 2022-04-29 MED ORDER — CLINDAMYCIN HCL 300 MG PO CAPS: 300.0000 mg | ORAL_CAPSULE | Freq: Three times a day (TID) | ORAL | 0 refills | Status: AC

## 2022-04-29 MED ORDER — PREDNISONE 10 MG (21) PO TBPK: ORAL_TABLET | Freq: Every day | ORAL | 0 refills | Status: AC

## 2022-04-29 MED ORDER — BENZONATATE 100 MG PO CAPS: 100.0000 mg | ORAL_CAPSULE | Freq: Three times a day (TID) | ORAL | 0 refills | Status: AC | PRN

## 2022-04-29 NOTE — ED Provider Notes (Signed)
Renaldo Fiddler    CSN: [id number] Arrival date & time: 04/29/22  4098      History   Chief Complaint Chief Complaint  Patient presents with   Cough    HPI Savannah Adkins is a 62 y.o. female. Patient presents with nonproductive cough x 1 week.  Treatment attempted with Robitussin and Zyrtec.  Patient also presents with pain due to an "abscess" of her left lower tooth.  She has not seen a dentist.  She is followed by the [place] in [place] and will request an appointment.  Patient also presents with pruritic rash on left forearm which she attributes to her medication or sun exposure. No treatment attempted.   Patient denies fever, chills, ear pain, sore throat, shortness of breath, chest pain, abdominal pain, vomiting, diarrhea, or other symptoms.   Her medical history includes trigeminal neuralgia, cervical spondylosis, memory disturbance, cervicogenic headache, degenerative arthritis, depression.     The history is provided by the patient and medical records.    Past Medical History:  Diagnosis Date   Cervical spondylosis without myelopathy 08/24/2012   Cervicogenic headache 03/14/2013   Degenerative arthritis    Depression    H. pylori infection    Memory disturbance 08/24/2012   Neuralgia    trigeminal   Raynaud disease    Spondylitis    Trigeminal neuralgia 08/24/2012   Right V2    Patient Active Problem List   Diagnosis Date Noted   Cervicogenic headache 03/14/2013   Disturbance of skin sensation 08/24/2012   Trigeminal neuralgia 08/24/2012   Cervical spondylosis without myelopathy 08/24/2012   Memory disturbance 08/24/2012    Past Surgical History:  Procedure Laterality Date   ABDOMINAL HYSTERECTOMY  1985   APPENDECTOMY  1972   BREAST BIOPSY  1985   Lumbosacral laminectomy     TONSILLECTOMY  1972    OB History   No obstetric history on file.      Home Medications    Prior to Admission medications   Medication Sig Start Date End Date Taking?  Authorizing Provider  benzonatate (TESSALON) 100 MG capsule Take 1 capsule (100 mg total) by mouth 3 (three) times daily as needed for cough. 04/29/22  Yes Mickie Bail, NP  clindamycin (CLEOCIN) 300 MG capsule Take 1 capsule (300 mg total) by mouth 3 (three) times daily. 04/29/22  Yes Mickie Bail, NP  predniSONE (STERAPRED UNI-PAK 21 TAB) 10 MG (21) TBPK tablet Take by mouth daily. As directed 04/29/22  Yes Mickie Bail, NP  acetaminophen (TYLENOL) 325 MG tablet Take 650 mg by mouth every 6 (six) hours as needed for pain.    [provider]  Cetirizine HCl 10 MG CAPS Take by mouth daily.    [provider]  dexlansoprazole (DEXILANT) 60 MG capsule Take 60 mg by mouth daily.    [provider]  hydroxychloroquine (PLAQUENIL) 200 MG tablet Take 400 mg by mouth daily.    [provider]  Oxcarbazepine (TRILEPTAL) 300 MG tablet Take 1 tablet (300 mg total) by mouth daily. 11/21/13   Butch Penny, NP  sertraline (ZOLOFT) 25 MG tablet Take 50 mg by mouth daily.    [provider]  tiZANidine (ZANAFLEX) 2 MG tablet Take 1 tablet (2 mg total) by mouth 3 (three) times daily. 07/25/13   York Spaniel, MD  traMADol (ULTRAM) 50 MG tablet Take 50 mg by mouth every 6 (six) hours as needed.    [provider]  Family History Family History  Problem Relation Age of Onset   Cancer - Lung Brother    Cancer Brother        stomach   Cancer Maternal Aunt        breast, colon   Diabetes Mother    Heart attack Father    Cancer Brother     Social History Social History   Tobacco Use   Smoking status: Never   Smokeless tobacco: Never  Substance Use Topics   Alcohol use: No   Drug use: No     Allergies   Dilantin [phenytoin sodium extended], Lortab [hydrocodone-acetaminophen], Tramadol, Codeine, Dilantin [phenytoin], Erythromycin, Gabapentin, Lamisil [terbinafine], Lyrica [pregabalin], Penicillins, Percocet [oxycodone-acetaminophen], and  Tetracyclines & related   Review of Systems Review of Systems  Constitutional:  Negative for chills and fever.  HENT:  Positive for dental problem. Negative for ear pain and sore throat.   Respiratory:  Positive for cough. Negative for shortness of breath and wheezing.   Cardiovascular:  Negative for chest pain and palpitations.  Gastrointestinal:  Negative for abdominal pain, diarrhea and vomiting.  Skin:  Positive for rash. Negative for color change.  All other systems reviewed and are negative.    Physical Exam Triage Vital Signs ED Triage Vitals  Enc Vitals Group     BP      Pulse      Resp      Temp      Temp src      SpO2      Weight      Height      Head Circumference      Peak Flow      Pain Score      Pain Loc      Pain Edu?      Excl. in GC?    No data found.  Updated Vital Signs BP 124/73   Pulse (!) 102   Temp 98.1 F (36.7 C)   Resp 18   SpO2 96%   Visual Acuity Right Eye Distance:   Left Eye Distance:   Bilateral Distance:    Right Eye Near:   Left Eye Near:    Bilateral Near:     Physical Exam Vitals and nursing note reviewed.  Constitutional:      General: She is not in acute distress.    Appearance: Normal appearance. She is well-developed. She is not ill-appearing.  HENT:     Head: Atraumatic.     Right Ear: Tympanic membrane normal.     Left Ear: Tympanic membrane normal.     Nose: Nose normal.     Mouth/Throat:     Mouth: Mucous membranes are moist.     Dentition: Abnormal dentition. Dental tenderness and dental caries present.     Pharynx: Oropharynx is clear.      Comments: Clear PND. Cardiovascular:     Rate and Rhythm: Normal rate and regular rhythm.     Heart sounds: Normal heart sounds.  Pulmonary:     Effort: Pulmonary effort is normal. No respiratory distress.     Breath sounds: Normal breath sounds.  Musculoskeletal:     Cervical back: Neck supple.  Skin:    General: Skin is warm and dry.     Findings: Rash  present.     Comments: Flesh-colored papular rash on left forearm.  No open wounds or drainage.   Neurological:     Mental Status: She is alert.  Psychiatric:  Mood and Affect: Mood normal.        Behavior: Behavior normal.      UC Treatments / Results  Labs (all labs ordered are listed, but only abnormal results are displayed) Labs Reviewed - No data to display  EKG   Radiology No results found.  Procedures Procedures (including critical care time)  Medications Ordered in UC Medications - No data to display  Initial Impression / Assessment and Plan / UC Course  I have reviewed the triage vital signs and the nursing notes.  Pertinent labs & imaging results that were available during my care of the patient were reviewed by me and considered in my medical decision making (see chart for details).   Nasal drip, seasonal allergies, cough, dental pain due to dental caries, rash.  Treating today with prednisone taper, clindamycin, Tessalon Perles.  Education provided on postnasal drip, cough, dental abscess, rash.  Instructed patient to follow up with her PCP on Monday.  She agrees to plan of care.   Final Clinical Impressions(s) / UC Diagnoses   Final diagnoses:  Postnasal drip  Seasonal allergies  Acute cough  Pain due to dental caries  Rash     Discharge Instructions      Take the prednisone, clindamycin, and Tessalon Perles as directed.  Follow up with your primary care provider on Monday.        ED Prescriptions     Medication Sig Dispense Auth. Provider   clindamycin (CLEOCIN) 300 MG capsule Take 1 capsule (300 mg total) by mouth 3 (three) times daily. 21 capsule Mickie Bail, NP   benzonatate (TESSALON) 100 MG capsule Take 1 capsule (100 mg total) by mouth 3 (three) times daily as needed for cough. 21 capsule Mickie Bail, NP   predniSONE (STERAPRED UNI-PAK 21 TAB) 10 MG (21) TBPK tablet Take by mouth daily. As directed 21 tablet Mickie Bail, NP       I have reviewed the PDMP during this encounter.   Mickie Bail, NP 04/29/22 [phone]

## 2022-04-29 NOTE — Discharge Instructions (Addendum)
Take the prednisone, clindamycin, and Tessalon Perles as directed.  Follow up with your primary care provider on Monday.

## 2022-04-29 NOTE — ED Triage Notes (Addendum)
Patient to Urgent Care with complaints of dry and nagging cough that over a week ago. Has been taking robitussin with little to no relief.  Reports waking up sweating. Has had no sleep due to continuous coughing.   Also reports rash present to her left forearm- reports this could be medication related.
